# Patient Record
Sex: Male | Born: 1990 | Hispanic: No | Marital: Single | Smoking: Never smoker
Health system: Southern US, Community
[De-identification: ages and names within clinical notes are randomized; demographics above are authoritative.]

---

## 2013-11-14 ENCOUNTER — Ambulatory Visit: Payer: Self-pay | Attending: Internal Medicine | Admitting: Internal Medicine

## 2013-11-14 ENCOUNTER — Encounter: Payer: Self-pay | Admitting: Internal Medicine

## 2013-11-14 VITALS — BP 111/69 | HR 69 | Temp 97.5°F | Resp 16 | Ht 62.0 in | Wt 102.0 lb

## 2013-11-14 DIAGNOSIS — Z2821 Immunization not carried out because of patient refusal: Secondary | ICD-10-CM | POA: Insufficient documentation

## 2013-11-14 DIAGNOSIS — Z0000 Encounter for general adult medical examination without abnormal findings: Secondary | ICD-10-CM | POA: Insufficient documentation

## 2013-11-14 NOTE — Progress Notes (Signed)
Pt is here to establish care. Pt has no C.O. Other that wanting see a dentist.

## 2013-11-14 NOTE — Progress Notes (Signed)
Patient ID: Grant MantleBi-Marty Nguyen, male   DOB: 12/14/1990, 23 y.o.   MRN: [id number]  [id number]  [id number]  DOB - 03/31/1990  CC:  Chief Complaint  Patient presents with  . Establish Care       HPI: Grant Nguyen is a 23 y.o. male here today to establish medical care.  Patient presents to clinic today for concerns of getting his vision and teeth checked.  He states that he has not had any problems with his vision but it is time for a general check up.  He denies dental pain or complications at this time, he is concerned about a cleaning.  He is a new immigrant from [place] and has been living here for the past 9 months.  He is here living alone without family.  He denies tobacco, drug, and alcohol use.    Patient has No headache, No chest pain, No abdominal pain - No Nausea, No new weakness tingling or numbness, No Cough - SOB.  No Known Allergies History reviewed. No pertinent past medical history. No current outpatient prescriptions on file prior to visit.   No current facility-administered medications on file prior to visit.   History reviewed. No pertinent family history. History   Social History  . Marital Status: Single    Spouse Name: N/A    Number of Children: N/A  . Years of Education: N/A   Occupational History  . Not on file.   Social History Main Topics  . Smoking status: Never Smoker   . Smokeless tobacco: Not on file  . Alcohol Use: No  . Drug Use: No  . Sexual Activity: No   Other Topics Concern  . Not on file   Social History Narrative  . No narrative on file    Review of Systems: Constitutional: Negative for fever, chills, diaphoresis, activity change, appetite change and fatigue. HENT: Negative for ear pain, nosebleeds, congestion, facial swelling, rhinorrhea, neck pain, neck stiffness and ear discharge.  Eyes: Negative for pain, discharge, redness, itching and visual disturbance. Respiratory: Negative for cough, choking, chest tightness,  shortness of breath, wheezing and stridor.  Cardiovascular: Negative for chest pain, palpitations and leg swelling. Gastrointestinal: Negative for abdominal distention. Genitourinary: Negative for dysuria, urgency, frequency, hematuria, flank pain, decreased urine volume, difficulty urinating and dyspareunia.  Musculoskeletal: Negative for back pain, joint swelling, arthralgia and gait problem. Neurological: Negative for dizziness, tremors, seizures, syncope, facial asymmetry, speech difficulty, weakness, light-headedness, numbness and headaches.  Hematological: Negative for adenopathy. Does not bruise/bleed easily. Psychiatric/Behavioral: Negative for hallucinations, behavioral problems, confusion, dysphoric mood, decreased concentration and agitation.    Objective:   Filed Vitals:   11/14/13 1526  BP: 111/69  Pulse: 69  Temp: 97.5 F (36.4 C)  Resp: 16    Physical Exam: Constitutional: Patient appears well-developed and well-nourished. No distress. HENT: Normocephalic, atraumatic, External right and left ear normal. Oropharynx is clear and moist.  Eyes: Conjunctivae and EOM are normal. PERRLA, no scleral icterus. Neck: Normal ROM. Neck supple. No JVD. No tracheal deviation. No thyromegaly. CVS: RRR, S1/S2 +, no murmurs, no gallops, no carotid bruit.  Pulmonary: Effort and breath sounds normal, no stridor, rhonchi, wheezes, rales.  Abdominal: Soft. BS +, no distension, tenderness, rebound or guarding.  Musculoskeletal: Normal range of motion. No edema and no tenderness.  Lymphadenopathy: No lymphadenopathy noted, cervical Neuro: Alert. Normal reflexes, muscle tone coordination. No cranial nerve deficit. Skin: Skin is warm and dry. No rash noted. Not diaphoretic. No erythema. No pallor. Psychiatric: Normal mood  and affect. Behavior, judgment, thought content normal.  No results found for this basename: WBC, HGB, HCT, MCV, PLT   No results found for this basename: CREATININE, BUN,  NA, K, CL, CO2    No results found for this basename: HGBA1C   Lipid Panel  No results found for this basename: chol, trig, hdl, cholhdl, vldl, ldlcalc       Assessment and plan:   Bi-Bron was seen today for establish care.  Diagnoses and associated orders for this visit:  Preventative health care - Ambulatory referral to Dentistry - Ambulatory referral to Ophthalmology  Refused influenza vaccine     Return if symptoms worsen or fail to improve.  Due to language barrier, an interpreter was present during the history-taking and subsequent discussion (and for part of the physical exam) with this patient.    Holland Commons, NP-C [facility] [phone] 11/14/2013, 3:44 PM

## 2013-12-22 ENCOUNTER — Ambulatory Visit: Payer: Self-pay | Admitting: Internal Medicine

## 2014-02-13 ENCOUNTER — Encounter: Payer: Self-pay | Admitting: Internal Medicine

## 2014-04-03 ENCOUNTER — Ambulatory Visit: Payer: Self-pay | Admitting: Internal Medicine

## 2014-04-11 ENCOUNTER — Ambulatory Visit: Payer: Self-pay | Admitting: Internal Medicine

## 2014-05-07 ENCOUNTER — Ambulatory Visit: Payer: Self-pay | Attending: Internal Medicine

## 2015-10-17 ENCOUNTER — Encounter (HOSPITAL_COMMUNITY): Payer: Self-pay | Admitting: Emergency Medicine

## 2015-10-17 ENCOUNTER — Ambulatory Visit (HOSPITAL_COMMUNITY)
Admission: EM | Admit: 2015-10-17 | Discharge: 2015-10-17 | Disposition: A | Discharge status: Home / Self Care | Payer: BLUE CROSS/BLUE SHIELD | Attending: Family Medicine | Admitting: Family Medicine

## 2015-10-17 DIAGNOSIS — R519 Headache, unspecified: Secondary | ICD-10-CM

## 2015-10-17 DIAGNOSIS — R51 Headache: Secondary | ICD-10-CM

## 2015-10-17 MED ORDER — KETOROLAC TROMETHAMINE 60 MG/2ML IM SOLN
INTRAMUSCULAR | Status: AC
  Filled 2015-10-17: qty 2

## 2015-10-17 MED ORDER — IBUPROFEN 800 MG PO TABS: 800.0000 mg | ORAL_TABLET | Freq: Three times a day (TID) | ORAL | 0 refills | Status: DC

## 2015-10-17 MED ORDER — KETOROLAC TROMETHAMINE 60 MG/2ML IM SOLN
60.0000 mg | Freq: Once | INTRAMUSCULAR | Status: AC
  Administered 2015-10-17: 60 mg via INTRAMUSCULAR

## 2015-10-17 NOTE — ED Provider Notes (Signed)
CSN: [id number]     Arrival date & time 10/17/15  1356 History   None    Chief Complaint  Patient presents with  . Migraine   (Consider location/radiation/quality/duration/timing/severity/associated sxs/prior Treatment) Patient c/o left side cephalgia x 1 day.  He is working and has difficulty today and had to call out and needs note for work.    Migraine  This is a new problem. The current episode started 1 to 2 hours ago. The problem occurs constantly. The problem has not changed since onset.Associated symptoms include headaches. The symptoms are aggravated by exertion. Nothing relieves the symptoms. He has tried nothing for the symptoms.    History reviewed. No pertinent past medical history. History reviewed. No pertinent surgical history. History reviewed. No pertinent family history. Social History  Substance Use Topics  . Smoking status: Never Smoker  . Smokeless tobacco: Never Used  . Alcohol use No    Review of Systems  Constitutional: Negative.   Eyes: Negative.   Respiratory: Negative.   Cardiovascular: Negative.   Endocrine: Negative.   Genitourinary: Negative.   Musculoskeletal: Negative.   Skin: Negative.   Allergic/Immunologic: Negative.   Neurological: Positive for headaches.  Hematological: Negative.   Psychiatric/Behavioral: Negative.     Allergies  Review of patient's allergies indicates no known allergies.  Home Medications   Prior to Admission medications   Medication Sig Start Date End Date Taking? Authorizing Provider  ibuprofen (ADVIL,MOTRIN) 800 MG tablet Take 1 tablet (800 mg total) by mouth 3 (three) times daily. 10/17/15   Deatra CanterWilliam J Oxford, FNP   Meds Ordered and Administered this Visit   Medications  ketorolac (TORADOL) injection 60 mg (60 mg Intramuscular Given 10/17/15 1524)    BP 107/77 (BP Location: Left Arm)   Pulse 68   Temp 98 F (36.7 C) (Oral)   Resp 12   SpO2 100%  No data found.   Physical Exam  Constitutional: He  is oriented to person, place, and time. He appears well-developed and well-nourished.  HENT:  Head: Normocephalic and atraumatic.  Right Ear: External ear normal.  Left Ear: External ear normal.  Mouth/Throat: Oropharynx is clear and moist.  Eyes: Conjunctivae are normal. Pupils are equal, round, and reactive to light.  Neck: Normal range of motion. Neck supple.  Cardiovascular: Normal rate, regular rhythm and normal heart sounds.   Pulmonary/Chest: Effort normal and breath sounds normal.  Musculoskeletal: He exhibits tenderness.  Tenderness left scalp and no rash seen  Neurological: He is alert and oriented to person, place, and time.  Nursing note and vitals reviewed.   Urgent Care Course   Clinical Course    Procedures (including critical care time)  Labs Review Labs Reviewed - No data to display  Imaging Review No results found.   Visual Acuity Review  Right Eye Distance:   Left Eye Distance:   Bilateral Distance:    Right Eye Near:   Left Eye Near:    Bilateral Near:         MDM   1. Bad headache    toradol 60mg  IM Naprosyn 500mg  one po bid x 10 days #20    Deatra CanterWilliam J Oxford, FNP 10/17/15 1534    Deatra CanterWilliam J Oxford, FNP 10/17/15 1537

## 2015-10-17 NOTE — ED Triage Notes (Signed)
Pt is here with an interpreter.  Interpreter states pt has had a headache on the left side of his head for one day.  It radiates down to his ear, with external ear pain.  Pt was given an unknown medication by the RN at his place of work yesterday with no relief.  Pt denies any fever.

## 2018-08-03 ENCOUNTER — Ambulatory Visit (HOSPITAL_COMMUNITY)
Admission: EM | Admit: 2018-08-03 | Discharge: 2018-08-03 | Disposition: A | Discharge status: Home / Self Care | Payer: BC Managed Care – PPO | Attending: Internal Medicine | Admitting: Internal Medicine

## 2018-08-03 ENCOUNTER — Encounter (HOSPITAL_COMMUNITY): Payer: Self-pay | Admitting: Emergency Medicine

## 2018-08-03 ENCOUNTER — Other Ambulatory Visit: Payer: Self-pay

## 2018-08-03 DIAGNOSIS — R51 Headache: Secondary | ICD-10-CM | POA: Diagnosis not present

## 2018-08-03 DIAGNOSIS — M25512 Pain in left shoulder: Secondary | ICD-10-CM | POA: Diagnosis not present

## 2018-08-03 DIAGNOSIS — R519 Headache, unspecified: Secondary | ICD-10-CM

## 2018-08-03 MED ORDER — IBUPROFEN 800 MG PO TABS: 800.0000 mg | ORAL_TABLET | Freq: Three times a day (TID) | ORAL | 0 refills | Status: AC

## 2018-08-03 NOTE — ED Provider Notes (Signed)
[facility]    CSN: [id number] Arrival date & time: 08/03/18  1225      History   Chief Complaint Chief Complaint  Patient presents with  . Headache  . Generalized Body Aches    HPI Amharic interpretation via [employer] interpreter Grant Nguyen is a 28 y.o. male presenting today for evaluation of headache and left shoulder pain and request for work note.  Patient states that he is mainly here in order to obtain a work note stating that he should continue to work in the same department he currently works in.  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  When he works in this department he gets headaches, lightheadedness and left shoulder pain.  He notes that he previously was given a note requesting avoidance of working in this department.  He is requesting a new note since his old note is no longer effective.  He denies history of vertigo or migraines.  He states that the spinning sensation triggers his symptoms.  He has not taken anything for his headache or shoulder pain. Denies vision changes or weakness. Denies significant history of headaches but does endorse history of similar when previously working in the other department. Denies fever, chills, bodyaches, cough. States he was tested at work for [employer] and was negative.   HPI  History reviewed. No pertinent past medical history.  There are no active problems to display for this patient.   History reviewed. No pertinent surgical history.     Home Medications    Prior to Admission medications   Medication Sig Start Date End Date Taking? Authorizing Provider  ibuprofen (ADVIL) 800 MG tablet Take 1 tablet (800 mg total) by mouth 3 (three) times daily. 08/03/18    Riggin Cuttino, Junius CreamerHallie C, PA-C    Family History History reviewed. No pertinent family history.  Social History Social History   Tobacco Use  . Smoking status: Never Smoker  . Smokeless tobacco: Never Used  Substance Use Topics  . Alcohol use: No  . Drug use: No     Allergies   Patient has no known allergies.   Review of Systems Review of Systems  Constitutional: Negative for fatigue and fever.  HENT: Negative for congestion, sinus pressure and sore throat.   Eyes: Negative for photophobia, pain and visual disturbance.  Respiratory: Negative for cough and shortness of breath.   Cardiovascular: Negative for chest pain.  Gastrointestinal: Negative for abdominal pain, nausea and vomiting.  Genitourinary: Negative for decreased urine volume and hematuria.  Musculoskeletal: Positive for arthralgias and myalgias. Negative for neck pain and neck stiffness.  Neurological: Positive for headaches. Negative for dizziness, syncope, facial asymmetry, speech difficulty, weakness, light-headedness and numbness.     Physical Exam Triage Vital Signs ED Triage Vitals  Enc Vitals Group     BP 08/03/18 1257 109/72     Pulse Rate 08/03/18 1257 69     Resp 08/03/18 1257 18     Temp 08/03/18 1257 97.7 F (36.5 C)     Temp Source 08/03/18 1257 Oral     SpO2 08/03/18 1257 98 %     Weight --      Height --      Head Circumference --  Peak Flow --      Pain Score 08/03/18 1258 4     Pain Loc --      Pain Edu? --      Excl. in [place]? --    No data found.  Updated Vital Signs BP 109/72 (BP Location: Left Arm)   Pulse 69   Temp 97.7 F (36.5 C) (Oral)   Resp 18   SpO2 98%   Visual Acuity Right Eye Distance:   Left Eye Distance:   Bilateral Distance:    Right Eye Near:   Left Eye Near:    Bilateral Near:     Physical Exam Vitals signs and nursing note reviewed.  Constitutional:      Appearance: He is well-developed.  HENT:     Head: Normocephalic and atraumatic.  Eyes:      Conjunctiva/sclera: Conjunctivae normal.  Neck:     Musculoskeletal: Neck supple.  Cardiovascular:     Rate and Rhythm: Normal rate and regular rhythm.     Heart sounds: No murmur.  Pulmonary:     Effort: Pulmonary effort is normal. No respiratory distress.     Breath sounds: Normal breath sounds.     Comments: Breathing comfortably at rest, CTABL, no wheezing, rales or other adventitious sounds auscultated  Abdominal:     Palpations: Abdomen is soft.     Tenderness: There is no abdominal tenderness.  Musculoskeletal:     Comments: Ambulating without abnormality Moving all extremiteies appropriately Bilateral shoulders- full active ROM, strength 5/5 bilaterally, mild tenderness in left trapezius musculature, nontender over clavicle, AC joint and scapular spine  Skin:    General: Skin is warm and dry.  Neurological:     General: No focal deficit present.     Mental Status: He is alert and oriented to person, place, and time. Mental status is at baseline.     Cranial Nerves: No cranial nerve deficit.      UC Treatments / Results  Labs (all labs ordered are listed, but only abnormal results are displayed) Labs Reviewed - No data to display  EKG None  Radiology No results found.  Procedures Procedures (including critical care time)  Medications Ordered in UC Medications - No data to display  Initial Impression / Assessment and Plan / UC Course  I have reviewed the triage vital signs and the nursing notes.  Pertinent labs & imaging results that were available during my care of the patient were reviewed by me and considered in my medical decision making (see chart for details).     Provided patient with ibuprofen to use for his headache and shoulder pain.  Patient's main purpose of visit today was to obtain work note in order to remain in his current department at [employer].  Did provide this to him given the symptoms he reported with the associated "spinning  machine".  He did not have any documented chronic medical problems, but do feel the problems he describes may limit him from continuing to work.  Shoulder pain most likely muscular.  Headache without no neuro deficits.  Will recommend NSAIDs as he is not taking anything for his symptoms.  Follow-up if not getting better.  Discussed strict return precautions. Patient verbalized understanding and is agreeable with plan.  Final Clinical Impressions(s) / UC Diagnoses   Final diagnoses:  Acute nonintractable headache, unspecified headache type  Pain in joint of left shoulder     Discharge Instructions     For headache/shoulder pain : Use  anti-inflammatories for pain/swelling. You may take up to 800 mg Ibuprofen every 8 hours with food. You may supplement Ibuprofen with Tylenol [phone] mg every 8 hours.   Follow up if not improving or worsening   ED Prescriptions    Medication Sig Dispense Auth. Provider   ibuprofen (ADVIL) 800 MG tablet Take 1 tablet (800 mg total) by mouth 3 (three) times daily. 30 tablet Caidence Kaseman, DavenportHallie C, PA-C     Controlled Substance Prescriptions [place] Controlled Substance Registry consulted? Not Applicable   Lew DawesWieters, Leiliana Foody C, [place]PA-C 08/04/18 [id number]

## 2018-08-03 NOTE — ED Triage Notes (Signed)
Pt sts HA and body aches; denies fever; pt sts needs work note

## 2018-08-03 NOTE — Discharge Instructions (Signed)
For headache/shoulder pain : Use anti-inflammatories for pain/swelling. You may take up to 800 mg Ibuprofen every 8 hours with food. You may supplement Ibuprofen with Tylenol [phone] mg every 8 hours.   Follow up if not improving or worsening

## 2018-08-04 ENCOUNTER — Telehealth (HOSPITAL_COMMUNITY): Payer: Self-pay | Admitting: Emergency Medicine

## 2018-08-04 NOTE — Telephone Encounter (Signed)
Patient return to clinic as work note from yesterday was not accepted by his job.  He is requesting to have documented medical conditions and triggers along with lifting restrictions.  Discussed with patient that he does not have any documented medical conditions for [place] to put indefinite restrictions on his work.  Provided a new note stating 10 days of no heavy lifting greater than 25 pounds, given current shoulder pain.  Also put on note that he has headaches that are triggered by spinning sensations, but he does not have documented migraines or any history of vertigo.  Advised that long-term work restrictions is more appropriate for primary care and that if any further evaluation or restrictions needed he needs to follow-up with primary care.  Provided contact for primary care at home sleep.  Mark interpretation via [employer] interpreter

## 2019-09-16 ENCOUNTER — Encounter (HOSPITAL_BASED_OUTPATIENT_CLINIC_OR_DEPARTMENT_OTHER): Payer: Self-pay

## 2019-09-16 ENCOUNTER — Other Ambulatory Visit: Payer: Self-pay

## 2019-09-16 ENCOUNTER — Emergency Department (HOSPITAL_BASED_OUTPATIENT_CLINIC_OR_DEPARTMENT_OTHER): Payer: Self-pay

## 2019-09-16 ENCOUNTER — Emergency Department (HOSPITAL_BASED_OUTPATIENT_CLINIC_OR_DEPARTMENT_OTHER)
Admission: EM | Admit: 2019-09-16 | Discharge: 2019-09-16 | Disposition: A | Discharge status: Home / Self Care | Payer: Self-pay | Attending: Emergency Medicine | Admitting: Emergency Medicine

## 2019-09-16 DIAGNOSIS — R0789 Other chest pain: Secondary | ICD-10-CM | POA: Insufficient documentation

## 2019-09-16 DIAGNOSIS — R519 Headache, unspecified: Secondary | ICD-10-CM | POA: Insufficient documentation

## 2019-09-16 DIAGNOSIS — R1010 Upper abdominal pain, unspecified: Secondary | ICD-10-CM | POA: Insufficient documentation
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
## 2019-09-16 NOTE — Discharge Instructions (Signed)
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
